# Patient Record
Sex: Female | Born: 1980 | Race: White | Hispanic: No | Marital: Single | Smoking: Never smoker
Health system: Southern US, Community
[De-identification: ages and names within clinical notes are randomized; demographics above are authoritative.]

## PROBLEM LIST (undated history)

## (undated) DIAGNOSIS — T7840XA Allergy, unspecified, initial encounter: Secondary | ICD-10-CM

## (undated) HISTORY — PX: RHINOPLASTY: SUR1284

## (undated) HISTORY — DX: Allergy, unspecified, initial encounter: T78.40XA

---

## 2014-10-13 ENCOUNTER — Emergency Department (HOSPITAL_COMMUNITY)
Admission: EM | Admit: 2014-10-13 | Discharge: 2014-10-13 | Disposition: A | Discharge status: Home / Self Care | Payer: BLUE CROSS/BLUE SHIELD | Attending: Emergency Medicine | Admitting: Emergency Medicine

## 2014-10-13 DIAGNOSIS — Y998 Other external cause status: Secondary | ICD-10-CM | POA: Insufficient documentation

## 2014-10-13 DIAGNOSIS — Y9389 Activity, other specified: Secondary | ICD-10-CM | POA: Diagnosis not present

## 2014-10-13 DIAGNOSIS — Y92410 Unspecified street and highway as the place of occurrence of the external cause: Secondary | ICD-10-CM | POA: Diagnosis not present

## 2014-10-13 DIAGNOSIS — S0990XA Unspecified injury of head, initial encounter: Secondary | ICD-10-CM | POA: Diagnosis not present

## 2014-10-13 MED ORDER — IBUPROFEN 400 MG PO TABS
600.0000 mg | ORAL_TABLET | Freq: Once | ORAL | Status: AC
  Administered 2014-10-13: 600 mg via ORAL
  Filled 2014-10-13: qty 2

## 2014-10-13 MED ORDER — NAPROXEN 375 MG PO TABS: 375.0000 mg | ORAL_TABLET | Freq: Two times a day (BID) | ORAL | Status: DC
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
## 2014-10-13 NOTE — Discharge Instructions (Signed)

## 2014-10-13 NOTE — ED Notes (Signed)
Declined W/C at D/C and was escorted to lobby by RN. 

## 2014-10-15 ENCOUNTER — Ambulatory Visit (INDEPENDENT_AMBULATORY_CARE_PROVIDER_SITE_OTHER): Payer: BLUE CROSS/BLUE SHIELD | Admitting: Urgent Care

## 2014-10-15 VITALS — BP 110/72 | HR 87 | Temp 98.1°F | Resp 18 | Ht 61.8 in | Wt 122.0 lb

## 2014-10-15 DIAGNOSIS — F0781 Postconcussional syndrome: Secondary | ICD-10-CM | POA: Diagnosis not present

## 2014-10-15 DIAGNOSIS — R51 Headache: Secondary | ICD-10-CM | POA: Diagnosis not present

## 2014-10-15 DIAGNOSIS — R519 Headache, unspecified: Secondary | ICD-10-CM

## 2014-10-15 DIAGNOSIS — R41840 Attention and concentration deficit: Secondary | ICD-10-CM

## 2014-10-15 MED ORDER — CYCLOBENZAPRINE HCL 10 MG PO TABS: 5.0000 mg | ORAL_TABLET | Freq: Three times a day (TID) | ORAL | Status: DC | PRN

## 2014-10-15 NOTE — Patient Instructions (Addendum)
Post-Concussion Syndrome  Post-concussion syndrome describes the symptoms that can occur after a head injury. These symptoms can last from weeks to months.  CAUSES   It is not clear why some head injuries cause post-concussion syndrome. It can occur whether your head injury was mild or severe and whether you were wearing head protection or not.   SIGNS AND SYMPTOMS  · Memory difficulties.  · Dizziness.  · Headaches.  · Double vision or blurry vision.  · Sensitivity to light.  · Hearing difficulties.  · Depression.  · Tiredness.  · Weakness.  · Difficulty with concentration.  · Difficulty sleeping or staying asleep.  · Vomiting.  · Poor balance or instability on your feet.  · Slow reaction time.  · Difficulty learning and remembering things you have heard.  DIAGNOSIS   There is no test to determine whether you have post-concussion syndrome. Your health care provider may order an imaging scan of your brain, such as a CT scan, to check for other problems that may be causing your symptoms (such as a severe injury inside your skull).  TREATMENT   Usually, these problems disappear over time without medical care. Your health care provider may prescribe medicine to help ease your symptoms. It is important to follow up with a neurologist to evaluate your recovery and address any lingering symptoms or issues.  HOME CARE INSTRUCTIONS   · Take medicines only as directed by your health care provider. Do not take aspirin. Aspirin can slow blood clotting.  · Sleep with your head slightly elevated to help with headaches.  · Avoid any situation where there is potential for another head injury. This includes football, hockey, soccer, basketball, martial arts, downhill snow sports, and horseback riding. Your condition will get worse every time you experience a concussion. You should avoid these activities until you are evaluated by the appropriate follow-up health care providers.  · Keep all follow-up visits as directed by your health  care provider. This is important.  SEEK MEDICAL CARE IF:  · You have increased problems paying attention or concentrating.  · You have increased difficulty remembering or learning new information.  · You need more time to complete tasks or assignments than before.  · You have increased irritability or decreased ability to cope with stress.  · You have more symptoms than before.  Seek medical care if you have any of the following symptoms for more than two weeks after your injury:  · Lasting (chronic) headaches.  · Dizziness or balance problems.  · Nausea.  · Vision problems.  · Increased sensitivity to noise or light.  · Depression or mood swings.  · Anxiety or irritability.  · Memory problems.  · Difficulty concentrating or paying attention.  · Sleep problems.  · Feeling tired all the time.  SEEK IMMEDIATE MEDICAL CARE IF:  · You have confusion or unusual drowsiness.  · Others find it difficult to wake you up.  · You have nausea or persistent, forceful vomiting.  · You feel like you are moving when you are not (vertigo). Your eyes may move rapidly back and forth.  · You have convulsions or faint.  · You have severe, persistent headaches that are not relieved by medicine.  · You cannot use your arms or legs normally.  · One of your pupils is larger than the other.  · You have clear or bloody discharge from your nose or ears.  · Your problems are getting worse, not better.  MAKE   SURE YOU:  · Understand these instructions.  · Will watch your condition.  · Will get help right away if you are not doing well or get worse.     This information is not intended to replace advice given to you by your health care provider. Make sure you discuss any questions you have with your health care provider.     Document Released: 06/13/2001 Document Revised: 01/12/2014 Document Reviewed: 03/29/2013  Elsevier Interactive Patient Education ©2016 [employer]vier Inc[employer]
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
## 2014-10-17 ENCOUNTER — Ambulatory Visit (INDEPENDENT_AMBULATORY_CARE_PROVIDER_SITE_OTHER): Payer: BLUE CROSS/BLUE SHIELD | Admitting: Emergency Medicine

## 2014-10-17 ENCOUNTER — Ambulatory Visit (INDEPENDENT_AMBULATORY_CARE_PROVIDER_SITE_OTHER): Payer: BLUE CROSS/BLUE SHIELD

## 2014-10-17 VITALS — BP 110/62 | HR 89 | Temp 98.5°F | Resp 18 | Ht 62.0 in | Wt 122.0 lb

## 2014-10-17 DIAGNOSIS — F0781 Postconcussional syndrome: Secondary | ICD-10-CM

## 2014-10-17 DIAGNOSIS — R519 Headache, unspecified: Secondary | ICD-10-CM

## 2014-10-17 DIAGNOSIS — R072 Precordial pain: Secondary | ICD-10-CM | POA: Diagnosis not present

## 2014-10-17 DIAGNOSIS — R41840 Attention and concentration deficit: Secondary | ICD-10-CM | POA: Diagnosis not present

## 2014-10-17 DIAGNOSIS — R51 Headache: Secondary | ICD-10-CM

## 2014-10-17 DIAGNOSIS — R0789 Other chest pain: Secondary | ICD-10-CM
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
## 2014-10-24 ENCOUNTER — Ambulatory Visit (INDEPENDENT_AMBULATORY_CARE_PROVIDER_SITE_OTHER): Payer: BLUE CROSS/BLUE SHIELD | Admitting: Family Medicine

## 2014-10-24 ENCOUNTER — Ambulatory Visit (INDEPENDENT_AMBULATORY_CARE_PROVIDER_SITE_OTHER): Payer: BLUE CROSS/BLUE SHIELD

## 2014-10-24 VITALS — BP 116/68 | HR 119 | Temp 98.7°F | Resp 17 | Ht 61.0 in | Wt 123.0 lb

## 2014-10-24 DIAGNOSIS — R072 Precordial pain: Secondary | ICD-10-CM

## 2014-10-24 DIAGNOSIS — R0609 Other forms of dyspnea: Secondary | ICD-10-CM | POA: Diagnosis not present

## 2014-10-24 DIAGNOSIS — R000 Tachycardia, unspecified: Secondary | ICD-10-CM

## 2014-10-24 LAB — D-DIMER, QUANTITATIVE (NOT AT ARMC): D DIMER QUANT: 0.43 ug{FEU}/mL (ref 0.00–0.48)

## 2014-10-24 MED ORDER — MELOXICAM 7.5 MG PO TABS: 7.5000 mg | ORAL_TABLET | Freq: Every day | ORAL | Status: DC
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
## 2014-10-24 NOTE — Patient Instructions (Signed)
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
## 2015-01-28 ENCOUNTER — Encounter: Payer: Self-pay | Admitting: Cardiovascular Disease

## 2015-01-28 ENCOUNTER — Ambulatory Visit (INDEPENDENT_AMBULATORY_CARE_PROVIDER_SITE_OTHER)
Admission: RE | Admit: 2015-01-28 | Discharge: 2015-01-28 | Disposition: A | Discharge status: Home / Self Care | Payer: BLUE CROSS/BLUE SHIELD | Source: Ambulatory Visit | Attending: Cardiovascular Disease | Admitting: Cardiovascular Disease

## 2015-01-28 ENCOUNTER — Ambulatory Visit (INDEPENDENT_AMBULATORY_CARE_PROVIDER_SITE_OTHER): Payer: BLUE CROSS/BLUE SHIELD | Admitting: Cardiovascular Disease

## 2015-01-28 VITALS — BP 108/76 | HR 92 | Ht 61.0 in | Wt 124.4 lb

## 2015-01-28 DIAGNOSIS — Z7189 Other specified counseling: Secondary | ICD-10-CM | POA: Diagnosis not present

## 2015-01-28 DIAGNOSIS — R0789 Other chest pain: Secondary | ICD-10-CM | POA: Diagnosis not present

## 2015-01-28 DIAGNOSIS — R079 Chest pain, unspecified: Secondary | ICD-10-CM

## 2015-01-28 DIAGNOSIS — Z7689 Persons encountering health services in other specified circumstances: Secondary | ICD-10-CM

## 2015-01-28 NOTE — Patient Instructions (Addendum)
Medication Instructions:  Your physician recommends that you continue on your current medications as directed. Please refer to the Current Medication list given to you today.  Labwork: NONE  Testing/Procedures: Non-Cardiac CT scanning, (CAT scanning), is a noninvasive, special x-ray that produces cross-sectional images of the body using x-rays and a computer. CT scans help physicians diagnose and treat medical conditions. For some CT exams, a contrast material is used to enhance visibility in the area of the body being studied. CT scans provide greater clarity and reveal more details than regular x-ray exams.  Your physician has requested that you have an exercise tolerance test. For further information please visit [email or web address] Please also follow instruction sheet, as given.  Follow-Up: Your physician wants you to follow-up as needed.  If you need a refill on your cardiac medications before your next appointment, please call your pharmacy.

## 2015-02-13 ENCOUNTER — Ambulatory Visit (INDEPENDENT_AMBULATORY_CARE_PROVIDER_SITE_OTHER): Payer: BLUE CROSS/BLUE SHIELD

## 2015-02-13 ENCOUNTER — Encounter: Payer: BLUE CROSS/BLUE SHIELD | Admitting: Physician Assistant

## 2015-02-13 DIAGNOSIS — R079 Chest pain, unspecified: Secondary | ICD-10-CM

## 2015-02-13 LAB — EXERCISE TOLERANCE TEST
CHL CUP RESTING HR STRESS: 87 {beats}/min
CHL RATE OF PERCEIVED EXERTION: 14
CSEPHR: 97 %
Estimated workload: 13.4 METS
Exercise duration (min): 12 min
MPHR: 188 {beats}/min
Peak HR: 184 {beats}/min

## 2015-03-01 ENCOUNTER — Emergency Department (HOSPITAL_COMMUNITY)
Admission: EM | Admit: 2015-03-01 | Discharge: 2015-03-01 | Disposition: A | Discharge status: Home / Self Care | Payer: BLUE CROSS/BLUE SHIELD | Source: Home / Self Care | Attending: Internal Medicine | Admitting: Internal Medicine

## 2015-03-01 ENCOUNTER — Encounter (HOSPITAL_COMMUNITY): Payer: Self-pay | Admitting: *Deleted

## 2015-03-01 DIAGNOSIS — B349 Viral infection, unspecified: Secondary | ICD-10-CM | POA: Diagnosis not present

## 2015-03-01 NOTE — Discharge Instructions (Signed)
flonase or nasacort nasal steroid sprays may be helpful in resolving congestion/drainage symptoms. Recheck as needed.

## 2015-03-01 NOTE — ED Provider Notes (Addendum)
CSN: [id number]     Arrival date & time 03/01/15  1540 History   First MD Initiated Contact with Patient 03/01/15 1657     Chief Complaint  Patient presents with  . URI   HPI  Patient is a 35 year old lady who presents today with onset of sinus drainage, cough, fever, some sore throat, the evening of February 21. She had a few episodes of emesis that evening. No nausea, no diarrhea. Symptoms are improving now, but she needs a note for work.  Past Medical History  Diagnosis Date  . Allergy    Past Surgical History  Procedure Laterality Date  . Rhinoplasty     Family History  Problem Relation Age of Onset  . Hyperlipidemia Mother   . Hypertension Mother   . Hyperlipidemia Father   . Heart disease Maternal Grandmother   . Hyperlipidemia Maternal Grandmother   . Hypertension Maternal Grandmother   . Heart disease Maternal Grandfather   . Hyperlipidemia Maternal Grandfather   . Hypertension Maternal Grandfather   . Diabetes Paternal Grandmother   . Mental illness Paternal Grandmother    Social History  Substance Use Topics  . Smoking status: Never Smoker   . Smokeless tobacco: None  . Alcohol Use: 2.4 oz/week    4 Standard drinks or equivalent per week    Review of Systems  All other systems reviewed and are negative.   Allergies  Review of patient's allergies indicates no known allergies.  Home Medications   Prior to Admission medications   Medication Sig Start Date End Date Taking? Authorizing Provider  norethindrone-ethinyl estradiol (MICROGESTIN,JUNEL,LOESTRIN) 1-20 MG-MCG tablet Take 1 tablet by mouth daily.    Historical Provider, MD      BP 118/72 mmHg  Pulse 72  Temp(Src) 98.6 F (37 C) (Oral)  Resp 18  LMP    Physical Exam  Constitutional: She is oriented to person, place, and time. No distress.  Alert, nicely groomed Sitting up on the end of the exam table  HENT:  Head: Atraumatic.  Voice sounds a little congested Left TM is quite dull, red  tinged Right TM is slightly dull, no erythema Moderate nasal congestion Throat is a little bit red  Eyes:  Conjugate gaze, no eye redness/drainage  Neck: Neck supple.  Cardiovascular: Normal rate and regular rhythm.   Pulmonary/Chest: No respiratory distress. She has no wheezes. She has no rales.  Lungs clear, symmetric breath sounds  Abdominal: She exhibits no distension.  Musculoskeletal: Normal range of motion.  No leg swelling  Neurological: She is alert and oriented to person, place, and time.  Skin: Skin is warm and dry.  No cyanosis  Nursing note and vitals reviewed.   ED Course  Procedures (including critical care time)   MDM   1. Acute viral syndrome    Flonase or Nasacort may be helpful in resolving congestion and drainage symptoms; recheck as needed    Eustace Moore, MD 03/01/15 1730  Eustace Moore, MD 03/03/15 [phone]

## 2015-03-01 NOTE — ED Notes (Signed)
Pt  Reports  Symptoms  Of  sorethroat  /  Cough  /  Congested      With  Symptoms  X  3  Days        Pt  States  She  Actually  Feels  Better  Today    But  Has  Missed  Work    - no  Flu   Shot  This  year
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
